# Patient Record
Sex: Male | Born: 1995 | Race: Black or African American | Hispanic: No | Marital: Single | Smoking: Current some day smoker
Health system: Southern US, Community
[De-identification: ages and names within clinical notes are randomized; demographics above are authoritative.]

---

## 2017-10-02 ENCOUNTER — Emergency Department (HOSPITAL_COMMUNITY)
Admission: EM | Admit: 2017-10-02 | Discharge: 2017-10-02 | Disposition: A | Discharge status: Home / Self Care | Payer: Self-pay | Attending: Emergency Medicine | Admitting: Emergency Medicine

## 2017-10-02 ENCOUNTER — Emergency Department (HOSPITAL_COMMUNITY): Payer: Self-pay

## 2017-10-02 ENCOUNTER — Encounter (HOSPITAL_COMMUNITY): Payer: Self-pay | Admitting: Emergency Medicine

## 2017-10-02 ENCOUNTER — Other Ambulatory Visit: Payer: Self-pay

## 2017-10-02 DIAGNOSIS — R002 Palpitations: Secondary | ICD-10-CM | POA: Insufficient documentation

## 2017-10-02 DIAGNOSIS — F419 Anxiety disorder, unspecified: Secondary | ICD-10-CM | POA: Insufficient documentation

## 2017-10-02 DIAGNOSIS — R079 Chest pain, unspecified: Secondary | ICD-10-CM | POA: Insufficient documentation

## 2017-10-02 DIAGNOSIS — F17200 Nicotine dependence, unspecified, uncomplicated: Secondary | ICD-10-CM | POA: Insufficient documentation

## 2017-10-02 DIAGNOSIS — R0602 Shortness of breath: Secondary | ICD-10-CM | POA: Insufficient documentation

## 2017-10-02 LAB — BASIC METABOLIC PANEL
Anion gap: 11 (ref 5–15)
BUN: 6 mg/dL (ref 6–20)
CALCIUM: 9.4 mg/dL (ref 8.9–10.3)
CHLORIDE: 98 mmol/L — AB (ref 101–111)
CO2: 25 mmol/L (ref 22–32)
CREATININE: 1.27 mg/dL — AB (ref 0.61–1.24)
GFR calc Af Amer: >60 mL/min (ref >60)
Glucose, Bld: 102 mg/dL — ABNORMAL HIGH (ref 65–99)
Potassium: 2.9 mmol/L — ABNORMAL LOW (ref 3.5–5.1)
Sodium: 134 mmol/L — ABNORMAL LOW (ref 135–145)

## 2017-10-02 LAB — RAPID URINE DRUG SCREEN, HOSP PERFORMED
AMPHETAMINES: NOT DETECTED
Barbiturates: NOT DETECTED
Benzodiazepines: NOT DETECTED
Cocaine: NOT DETECTED
Opiates: NOT DETECTED
TETRAHYDROCANNABINOL: POSITIVE — AB

## 2017-10-02 LAB — CBC
HCT: 42.7 % (ref 39.0–52.0)
Hemoglobin: 15.2 g/dL (ref 13.0–17.0)
MCH: 31.8 pg (ref 26.0–34.0)
MCHC: 35.6 g/dL (ref 30.0–36.0)
MCV: 89.3 fL (ref 78.0–100.0)
PLATELETS: 330 10*3/uL (ref 150–400)
RBC: 4.78 MIL/uL (ref 4.22–5.81)
RDW: 11.7 % (ref 11.5–15.5)
WBC: 8.2 10*3/uL (ref 4.0–10.5)

## 2017-10-02 LAB — D-DIMER, QUANTITATIVE (NOT AT ARMC): D DIMER QUANT: 1.85 ug{FEU}/mL — AB (ref 0.00–0.50)

## 2017-10-02 LAB — I-STAT TROPONIN, ED: Troponin i, poc: 0 ng/mL (ref 0.00–0.08)

## 2017-10-02 MED ORDER — IOPAMIDOL (ISOVUE-370) INJECTION 76%
INTRAVENOUS | Status: AC
  Filled 2017-10-02: qty 100

## 2017-10-02 MED ORDER — LORAZEPAM 2 MG/ML IJ SOLN
1.0000 mg | Freq: Once | INTRAMUSCULAR | Status: AC
  Administered 2017-10-02: 1 mg via INTRAVENOUS
  Filled 2017-10-02: qty 1

## 2017-10-02 MED ORDER — POTASSIUM CHLORIDE CRYS ER 20 MEQ PO TBCR
40.0000 meq | EXTENDED_RELEASE_TABLET | Freq: Once | ORAL | Status: AC
Start: 2017-10-02 — End: 2017-10-02
  Administered 2017-10-02: 40 meq via ORAL
  Filled 2017-10-02: qty 2

## 2017-10-02 MED ORDER — LACTATED RINGERS IV BOLUS
1000.0000 mL | Freq: Once | INTRAVENOUS | Status: AC
  Administered 2017-10-02: 1000 mL via INTRAVENOUS

## 2017-10-02 MED ORDER — IOPAMIDOL (ISOVUE-370) INJECTION 76%
100.0000 mL | Freq: Once | INTRAVENOUS | Status: AC | PRN
  Administered 2017-10-02: 57 mL via INTRAVENOUS

## 2017-10-02 NOTE — ED Notes (Signed)
ED Provider at bedside. 

## 2017-10-02 NOTE — ED Notes (Signed)
Patient transported to CT 

## 2017-10-02 NOTE — ED Provider Notes (Signed)
Emergency Department Provider Note   I have reviewed the triage vital signs and the nursing notes.   HISTORY  Chief Complaint Tachycardia and Chest Pain   HPI Derek Andrade is a 22 y.o. male without significant medical problems the presents emergency department today secondary to shortness of breath, palpitations and chest pain.  Patient states that he has been drinking a lot of energy drinks recently and today was smoking marijuana and progressively had worsening palpitations, chest pain and anxiety to the point remain to start running around which made everything worse.  Comes here for further evaluation still with low-grade chest pain and anxiety.  Patient states he had a treatment about his dead father that seems to sparked a lot of this.  Does not know if the marijuana was laced with anything but nobody else has any symptoms. No other drugs/alcohol today. No recent illnesses, cough, fever, leg swelling.  No other associated or modifying symptoms.    History reviewed. No pertinent past medical history.  There are no active problems to display for this patient.   History reviewed. No pertinent surgical history.    Allergies Patient has no known allergies.  No family history on file.  Social History Social History   Tobacco Use  . Smoking status: Current Some Day Smoker  . Smokeless tobacco: Never Used  Substance Use Topics  . Alcohol use: Yes  . Drug use: Never    Review of Systems  All other systems negative except as documented in the HPI. All pertinent positives and negatives as reviewed in the HPI. ____________________________________________   PHYSICAL EXAM:  VITAL SIGNS: ED Triage Vitals  Enc Vitals Group     BP 10/02/17 2101 (!) 161/103     Pulse Rate 10/02/17 2101 (!) 111     Resp 10/02/17 2101 16     Temp 10/02/17 2101 99 F (37.2 C)     Temp Source 10/02/17 2101 Oral     SpO2 10/02/17 2101 98 %    Constitutional: Alert and oriented. Well  appearing and in no acute distress. Anxious. Eyes: Conjunctivae are normal. PERRL. EOMI. Head: Atraumatic. Nose: No congestion/rhinnorhea. Mouth/Throat: Mucous membranes are moist.  Oropharynx non-erythematous. Neck: No stridor.  No meningeal signs.   Cardiovascular: tachycardic rate, regular rhythm. Good peripheral circulation. Grossly normal heart sounds.   Respiratory: Normal respiratory effort.  No retractions. Lungs CTAB. Gastrointestinal: Soft and nontender. No distention.  Musculoskeletal: No lower extremity tenderness nor edema. No gross deformities of extremities. Neurologic:  Normal speech and language. No gross focal neurologic deficits are appreciated.  Skin:  Skin is warm, dry and intact. No rash noted.   ____________________________________________   LABS (all labs ordered are listed, but only abnormal results are displayed)  Labs Reviewed  BASIC METABOLIC PANEL - Abnormal; Notable for the following components:      Result Value   Sodium 134 (*)    Potassium 2.9 (*)    Chloride 98 (*)    Glucose, Bld 102 (*)    Creatinine, Ser 1.27 (*)    All other components within normal limits  D-DIMER, QUANTITATIVE (NOT AT [facility]) - Abnormal; Notable for the following components:   D-Dimer, Quant 1.85 (*)    All other components within normal limits  RAPID URINE DRUG SCREEN, HOSP PERFORMED - Abnormal; Notable for the following components:   Tetrahydrocannabinol POSITIVE (*)    All other components within normal limits  CBC  I-STAT TROPONIN, ED   ____________________________________________  EKG  EKG Interpretation  Date/Time:  Tuesday October 02 2017 21:03:52 EDT Ventricular Rate:  139 PR Interval:  132 QRS Duration: 76 QT Interval:  356 QTC Calculation: 541 R Axis:   92 Text Interpretation:  Sinus tachycardia Rightward axis Borderline ECG No old tracing to compare Confirmed by Marily Memos [phone]) on 10/02/2017 9:19:27 PM        ____________________________________________  RADIOLOGY  Dg Chest 2 View  Result Date: 10/02/2017 CLINICAL DATA:  Shortness of breath, chest pain. EXAM: CHEST - 2 VIEW COMPARISON:  None. FINDINGS: The heart size and mediastinal contours are within normal limits. Both lungs are clear. No pneumothorax or pleural effusion is noted. The visualized skeletal structures are unremarkable. IMPRESSION: No active cardiopulmonary disease. Electronically Signed   By: Lupita Raider, M.D.   On: 10/02/2017 21:45   Ct Angio Chest Pe W And/or Wo Contrast  Result Date: 10/02/2017 CLINICAL DATA:  Shortness of breath, tachycardia and chest pain. EXAM: CT ANGIOGRAPHY CHEST WITH CONTRAST TECHNIQUE: Multidetector CT imaging of the chest was performed using the standard protocol during bolus administration of intravenous contrast. Multiplanar CT image reconstructions and MIPs were obtained to evaluate the vascular anatomy. CONTRAST:  57 mL ISOVUE-370 IOPAMIDOL (ISOVUE-370) INJECTION 76% COMPARISON:  Chest radiograph 10/02/2017 FINDINGS: Cardiovascular: Contrast injection is sufficient to demonstrate satisfactory opacification of the pulmonary arteries to the segmental level. There is no pulmonary embolus. The main pulmonary artery is within normal limits for size. Area of hypoattenuation within branches of the right upper lobe pulmonary artery is suspected to be artifactual related to the nearby contrast bolus in the superior vena cava. There is a normal 3-vessel arch branching pattern without evidence of acute aortic syndrome. There is noaortic atherosclerosis. Heart size is normal, without pericardial effusion. Mediastinum/Nodes: No mediastinal, hilar or axillary lymphadenopathy. The visualized thyroid and thoracic esophageal course are unremarkable. Lungs/Pleura: No pulmonary nodules or masses. No pleural effusion or pneumothorax. No focal airspace consolidation. No focal pleural abnormality. Upper Abdomen: Contrast  bolus timing is not optimized for evaluation of the abdominal organs. Within this limitation, the visualized organs of the upper abdomen are normal. Musculoskeletal: No chest wall abnormality. No acute or significant osseous findings. Review of the MIP images confirms the above findings. IMPRESSION: No pulmonary embolus or other acute thoracic abnormality Electronically Signed   By: Deatra Robinson M.D.   On: 10/02/2017 23:29    ____________________________________________   PROCEDURES  Procedure(s) performed:   Procedures   ____________________________________________   INITIAL IMPRESSION / ASSESSMENT AND PLAN / ED COURSE Anxious vs PE vs sympathomimetic syndrome.  -bolus - dimer, trop, ecg, cxr Ativan uds  Workup unremarkable. Ct negative for PE. Improved HR. Improved symptoms. Suspect mostly anxiety/panic attack.    Pertinent labs & imaging results that were available during my care of the patient were reviewed by me and considered in my medical decision making (see chart for details).  ____________________________________________  FINAL CLINICAL IMPRESSION(S) / ED DIAGNOSES  Final diagnoses:  Palpitations     MEDICATIONS GIVEN DURING THIS VISIT:  Medications  lactated ringers bolus 1,000 mL (0 mLs Intravenous Stopped 10/02/17 2243)  LORazepam (ATIVAN) injection 1 mg (1 mg Intravenous Given 10/02/17 2156)  potassium chloride SA (K-DUR,KLOR-CON) CR tablet 40 mEq (40 mEq Oral Given 10/02/17 2234)  iopamidol (ISOVUE-370) 76 % injection 100 mL (57 mLs Intravenous Contrast Given 10/02/17 2312)     NEW OUTPATIENT MEDICATIONS STARTED DURING THIS VISIT:  New Prescriptions   No medications on file    Note:  This note was prepared with assistance of Conservation officer, historic buildings. Occasional wrong-word or sound-a-like substitutions may have occurred due to the inherent limitations of voice recognition software.   Marily Memos, MD 10/02/17 [phone]

## 2017-10-02 NOTE — ED Triage Notes (Signed)
Pt c/o chest tightness and shortness of breath that started suddenly today. HR in triage 130-150. Pt reports no known medical hx, drinks "multiple" energy drinks a day.

## 2018-10-01 IMAGING — CT CT ANGIO CHEST
2 of 6 series · 18 of 36 positions shown · IV contrast (iopamidol)
Comparison: Chest radiograph 10/02/2017

CLINICAL DATA: Shortness of breath, tachycardia and chest pain.

EXAM:
CT ANGIOGRAPHY CHEST WITH CONTRAST
TECHNIQUE: Multidetector CT imaging of the chest was performed using the
standard protocol during bolus administration of intravenous
contrast. Multiplanar CT image reconstructions and MIPs were
obtained to evaluate the vascular anatomy.
CONTRAST:  57 mL [id number]-WPO IOPAMIDOL ([id number]-WPO) INJECTION 76%

[Series 8: pe thins · axial · 0.62mm/px · z∈[+1196,+1468]mm · 17 of 308 slices shown]
[im 18/308  lung]
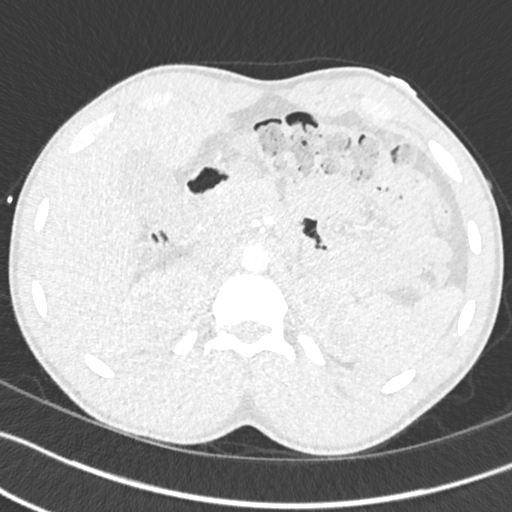
[im 35/308  mediastinal]
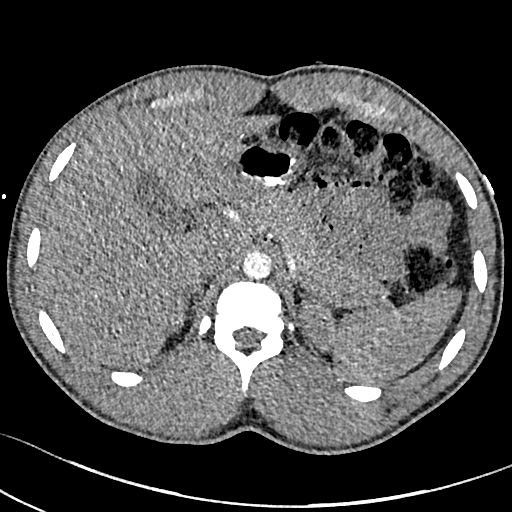
[im 52/308  lung]
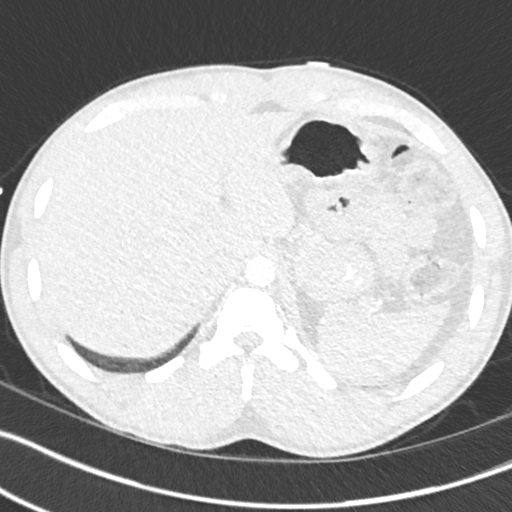
[im 69/308  mediastinal]
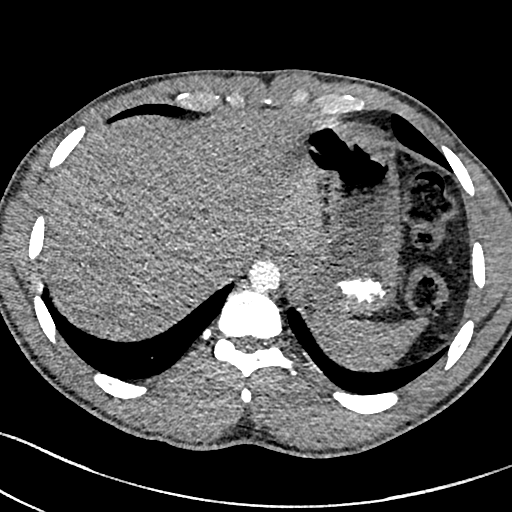
[im 86/308  lung]
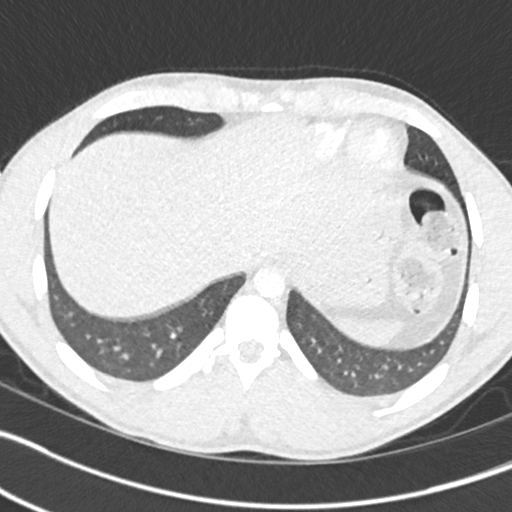
[im 103/308  mediastinal]
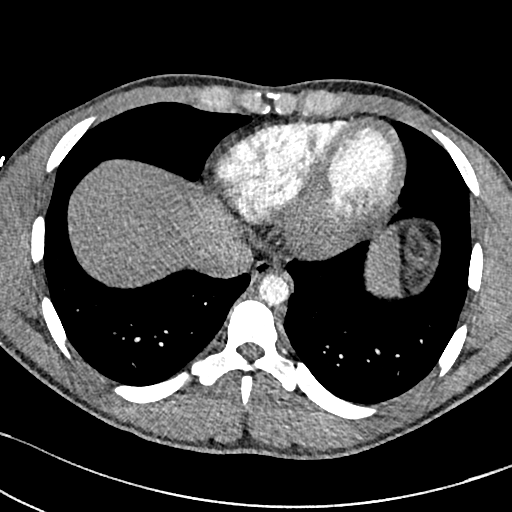
[im 120/308  lung]
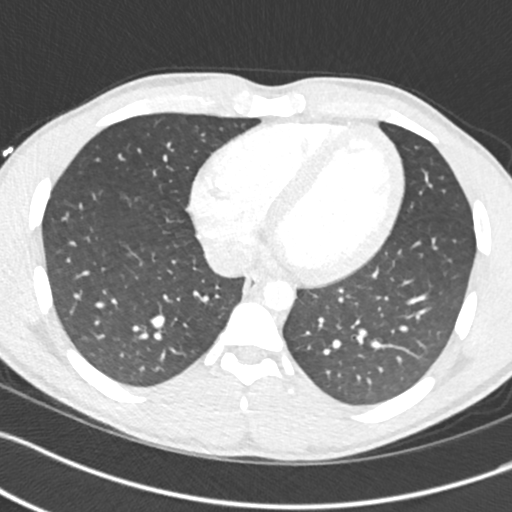
[im 137/308  mediastinal]
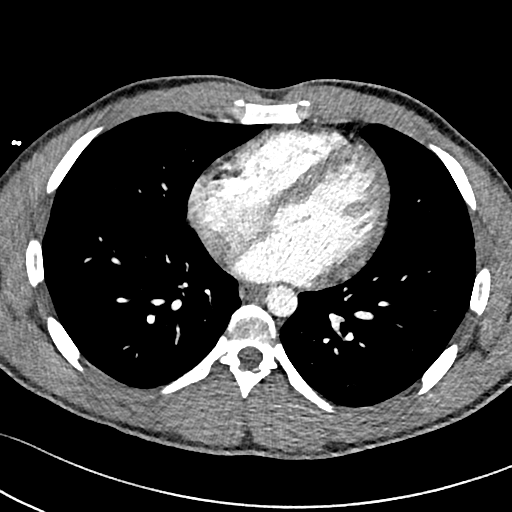
[im 154/308  lung]
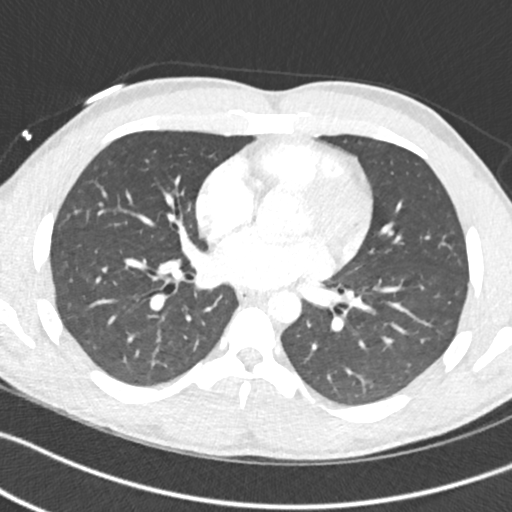
[im 171/308  mediastinal]
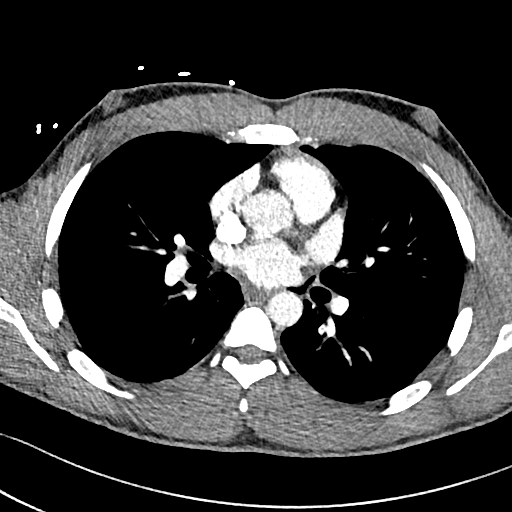
[im 188/308  lung]
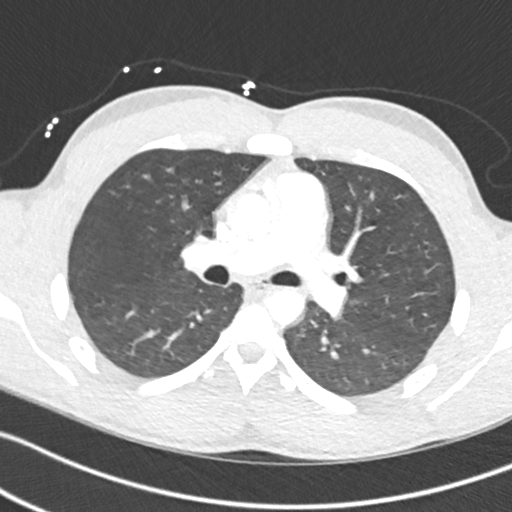
[im 205/308  mediastinal]
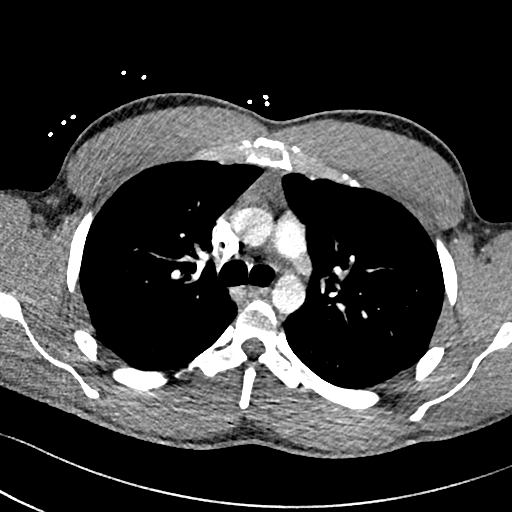
[im 222/308  lung]
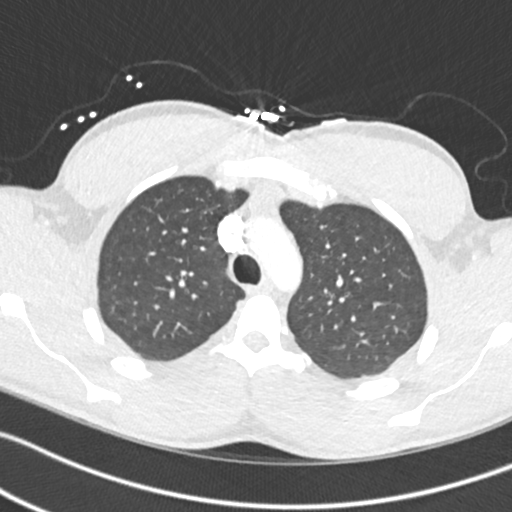
[im 239/308  mediastinal]
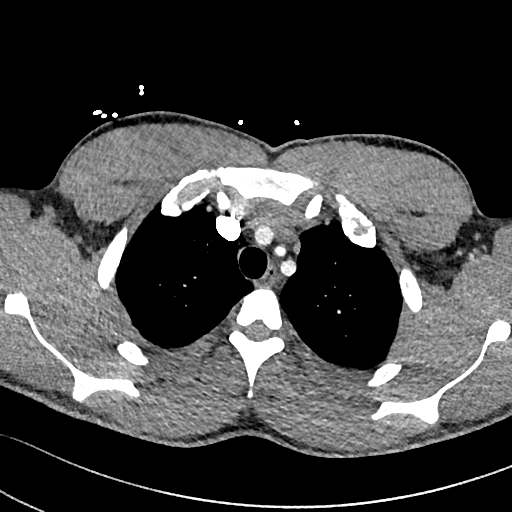
[im 256/308  lung]
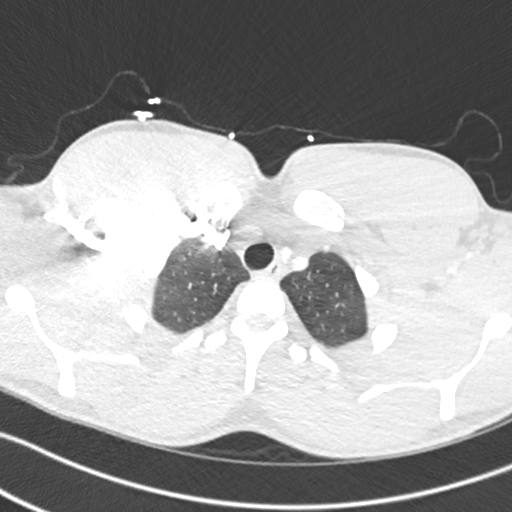
[im 273/308  mediastinal]
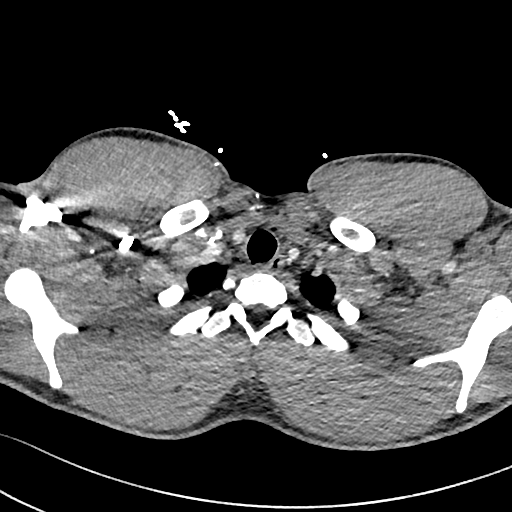
[im 290/308  lung]
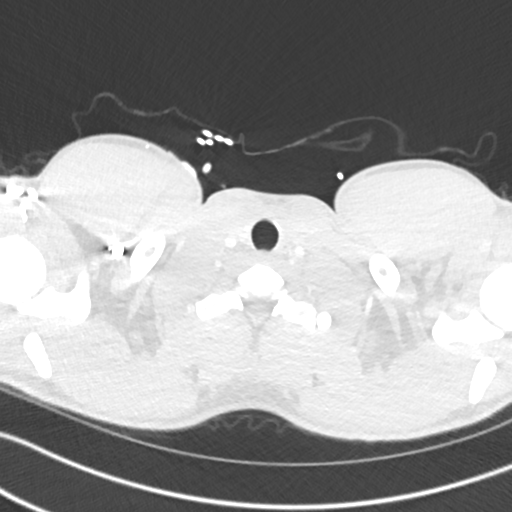

[Series 9: pe 2mm cor · coronal · 0.60mm/px · 1 of 116 slices shown]
[im 58/116  mediastinal]
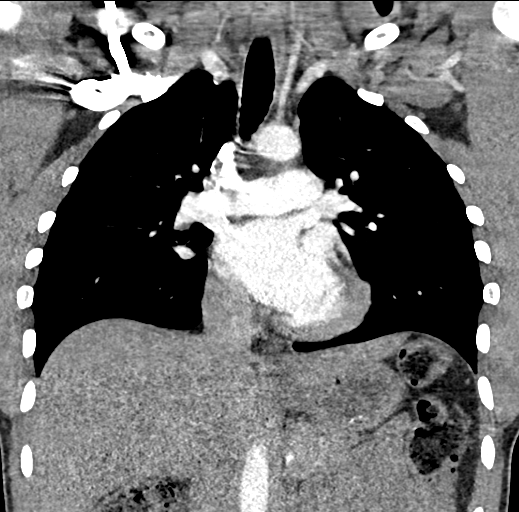

[18 of 36 positions shown; findings below may reference images not displayed]

FINDINGS: Cardiovascular: Contrast injection is sufficient to demonstrate
satisfactory opacification of the pulmonary arteries to the
segmental level. There is no pulmonary embolus. The main pulmonary
artery is within normal limits for size. Area of hypoattenuation
within branches of the right upper lobe pulmonary artery is
suspected to be artifactual related to the nearby contrast bolus in
the superior vena cava. There is a normal 3-vessel arch branching
pattern without evidence of acute aortic syndrome. There is noaortic
atherosclerosis. Heart size is normal, without pericardial effusion.

Mediastinum/Nodes: No mediastinal, hilar or axillary
lymphadenopathy. The visualized thyroid and thoracic esophageal
course are unremarkable.

Lungs/Pleura: No pulmonary nodules or masses. No pleural effusion or
pneumothorax. No focal airspace consolidation. No focal pleural
abnormality.

Upper Abdomen: Contrast bolus timing is not optimized for evaluation
of the abdominal organs. Within this limitation, the visualized
organs of the upper abdomen are normal.

Musculoskeletal: No chest wall abnormality. No acute or significant
osseous findings.

Review of the MIP images confirms the above findings.
IMPRESSION: No pulmonary embolus or other acute thoracic abnormality

## 2018-10-01 IMAGING — CR DG CHEST 2V
2 series · 2 of 2 positions shown · non-contrast
Comparison: None.

CLINICAL DATA: Shortness of breath, chest pain.

EXAM:
CHEST - 2 VIEW

[chest lat]
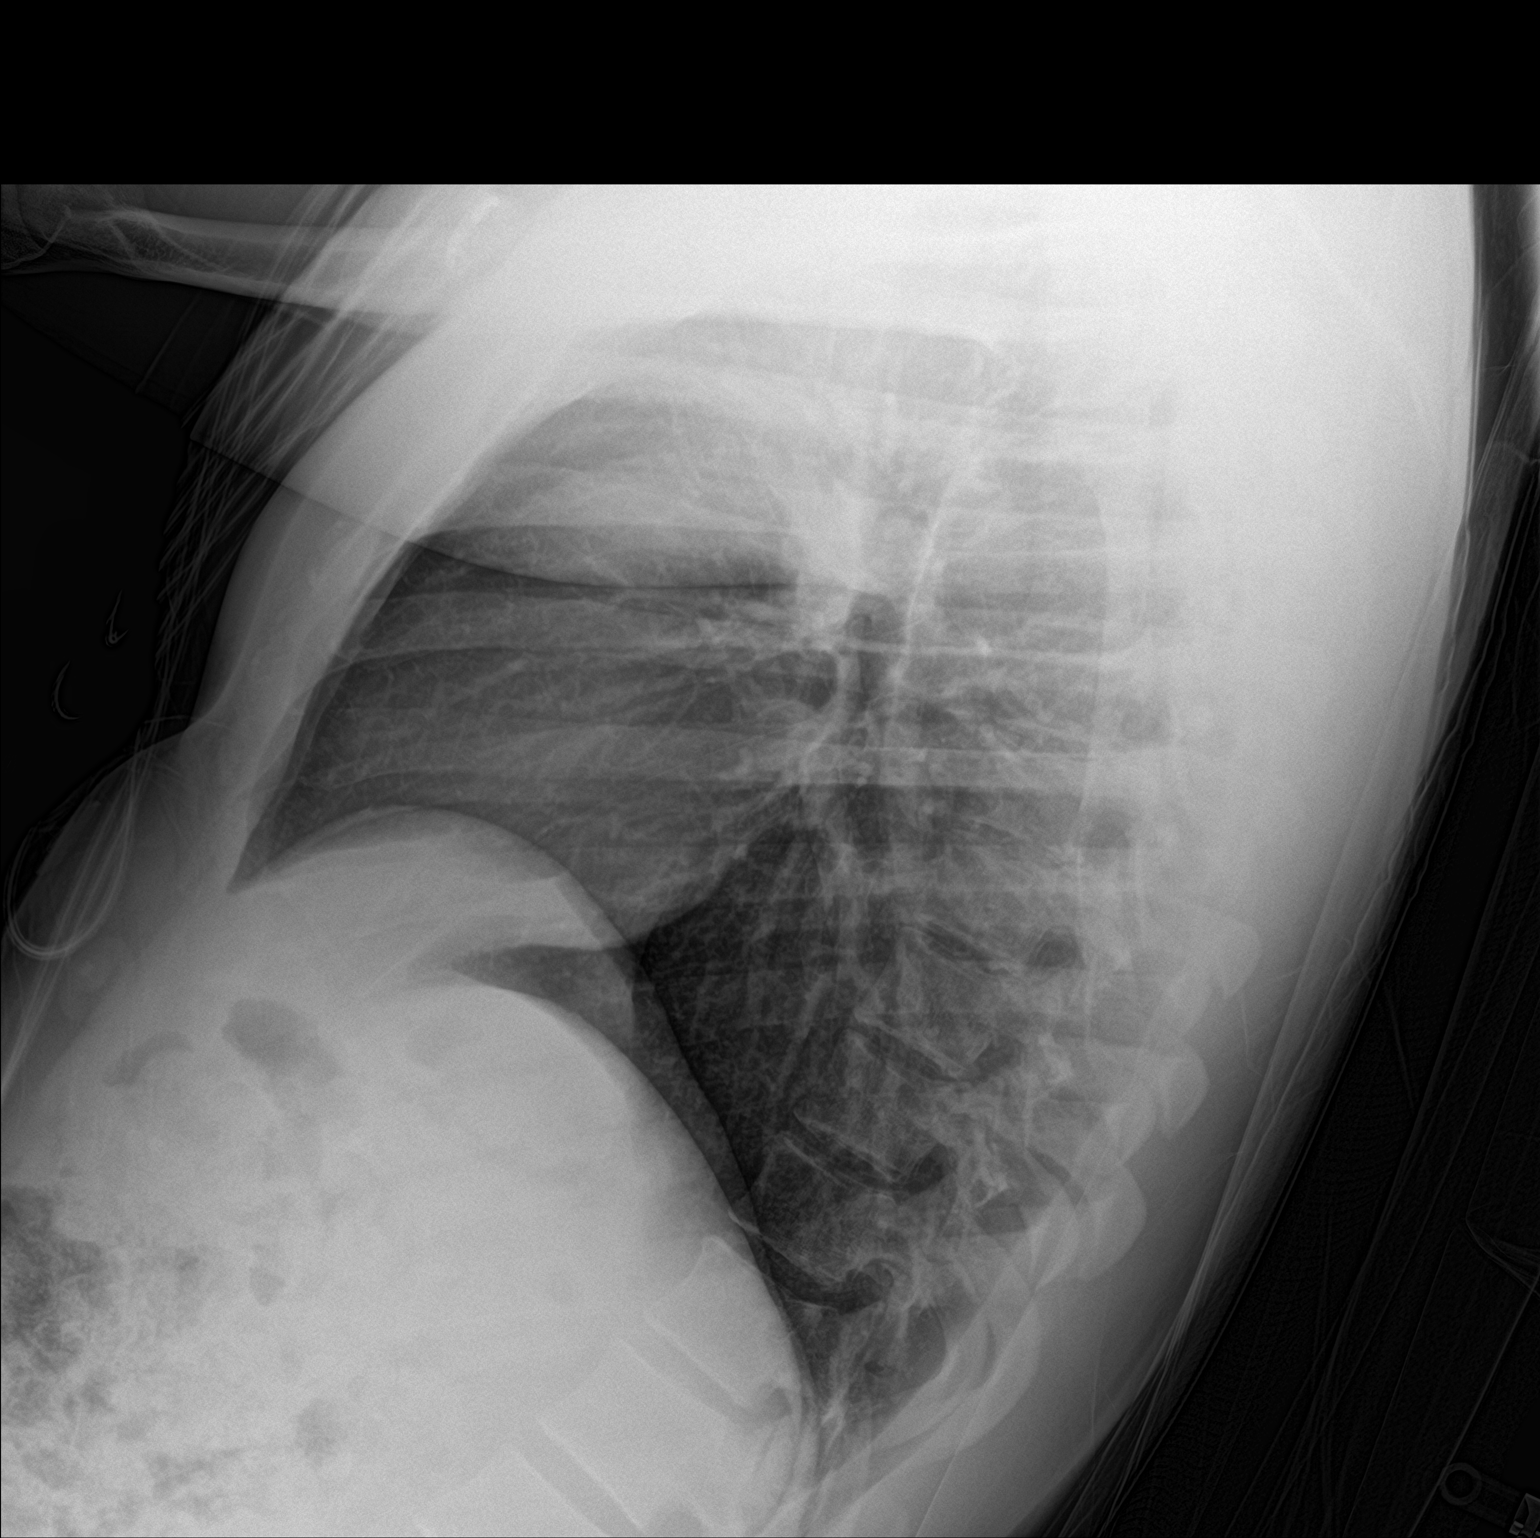

[chest ap]
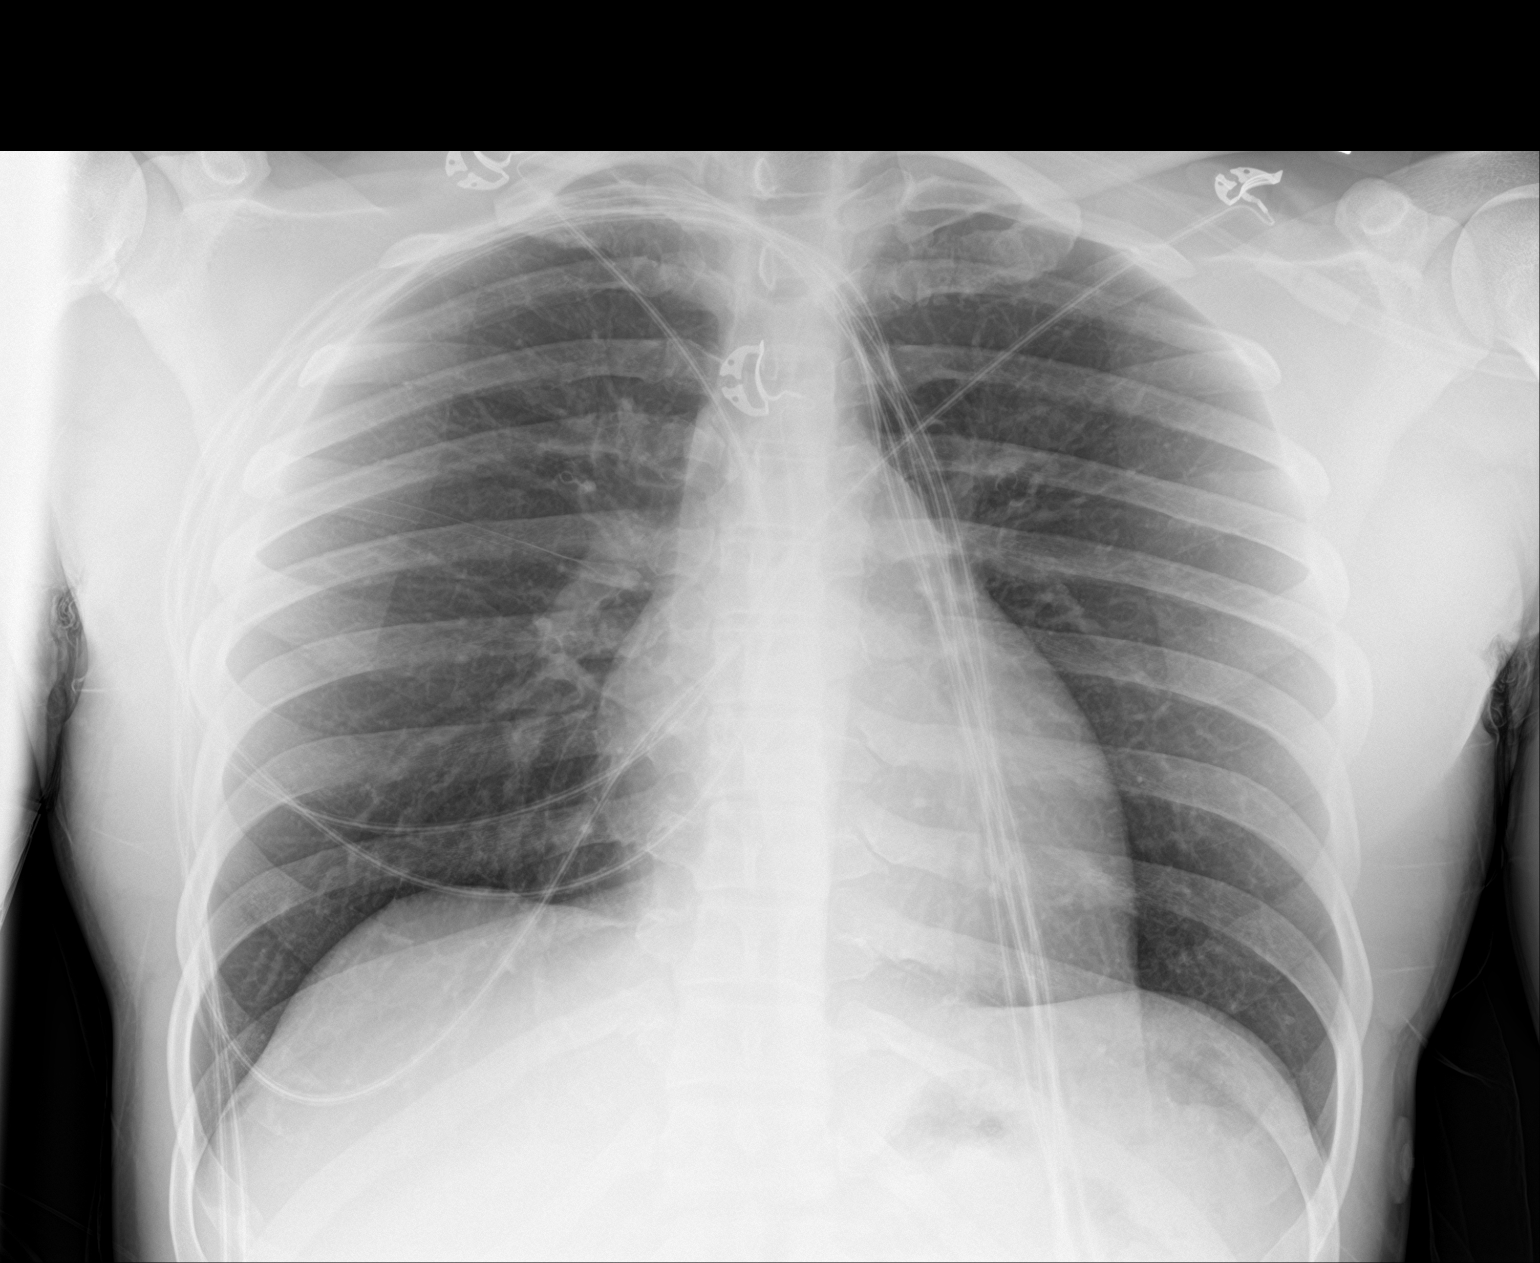

[2 of 2 positions shown; findings below may reference images not displayed]

FINDINGS: The heart size and mediastinal contours are within normal limits.
Both lungs are clear. No pneumothorax or pleural effusion is noted.
The visualized skeletal structures are unremarkable.
IMPRESSION: No active cardiopulmonary disease.
